# Patient Record
Sex: Female | Born: 1995
Health system: Southern US, Community
[De-identification: ages and names within clinical notes are randomized; demographics above are authoritative.]

## PROBLEM LIST (undated history)

## (undated) DIAGNOSIS — Z789 Other specified health status: Secondary | ICD-10-CM

## (undated) HISTORY — PX: ABDOMINAL SURGERY: SHX537

---

## 2016-08-24 ENCOUNTER — Inpatient Hospital Stay (HOSPITAL_COMMUNITY)
Admission: AD | Admit: 2016-08-24 | Discharge: 2016-08-24 | Disposition: A | Discharge status: Home / Self Care | Payer: Medicaid Other | Source: Ambulatory Visit | Attending: Family Medicine | Admitting: Family Medicine

## 2016-08-24 ENCOUNTER — Encounter (HOSPITAL_COMMUNITY): Payer: Self-pay

## 2016-08-24 DIAGNOSIS — N921 Excessive and frequent menstruation with irregular cycle: Secondary | ICD-10-CM | POA: Diagnosis not present

## 2016-08-24 DIAGNOSIS — Z79899 Other long term (current) drug therapy: Secondary | ICD-10-CM | POA: Diagnosis not present

## 2016-08-24 DIAGNOSIS — N939 Abnormal uterine and vaginal bleeding, unspecified: Secondary | ICD-10-CM | POA: Diagnosis not present

## 2016-08-24 HISTORY — DX: Other specified health status: Z78.9

## 2016-08-24 LAB — CBC WITH DIFFERENTIAL/PLATELET
Basophils Absolute: 0 10*3/uL (ref 0.0–0.1)
Basophils Relative: 1 %
Eosinophils Absolute: 0.1 10*3/uL (ref 0.0–0.7)
Eosinophils Relative: 2 %
HEMATOCRIT: 40.2 % (ref 36.0–46.0)
HEMOGLOBIN: 14.5 g/dL (ref 12.0–15.0)
LYMPHS ABS: 2.8 10*3/uL (ref 0.7–4.0)
LYMPHS PCT: 65 %
MCH: 29.7 pg (ref 26.0–34.0)
MCHC: 36.1 g/dL — AB (ref 30.0–36.0)
MCV: 82.2 fL (ref 78.0–100.0)
MONOS PCT: 5 %
Monocytes Absolute: 0.2 10*3/uL (ref 0.1–1.0)
NEUTROS ABS: 1.1 10*3/uL — AB (ref 1.7–7.7)
NEUTROS PCT: 27 %
Platelets: 296 10*3/uL (ref 150–400)
RBC: 4.89 MIL/uL (ref 3.87–5.11)
RDW: 12.9 % (ref 11.5–15.5)
WBC: 4.2 10*3/uL (ref 4.0–10.5)

## 2016-08-24 LAB — URINALYSIS, ROUTINE W REFLEX MICROSCOPIC
Bacteria, UA: NONE SEEN
Bilirubin Urine: NEGATIVE
GLUCOSE, UA: NEGATIVE mg/dL
Ketones, ur: NEGATIVE mg/dL
Leukocytes, UA: NEGATIVE
NITRITE: NEGATIVE
PH: 6 (ref 5.0–8.0)
PROTEIN: NEGATIVE mg/dL
SPECIFIC GRAVITY, URINE: 1.024 (ref 1.005–1.030)

## 2016-08-24 LAB — WET PREP, GENITAL
Clue Cells Wet Prep HPF POC: NONE SEEN
SPERM: NONE SEEN
Trich, Wet Prep: NONE SEEN
WBC, Wet Prep HPF POC: NONE SEEN
Yeast Wet Prep HPF POC: NONE SEEN

## 2016-08-24 LAB — POCT PREGNANCY, URINE: Preg Test, Ur: NEGATIVE

## 2016-08-24 NOTE — MAU Note (Signed)
Pt states she has been bleeding for 2 days, is on OCP. Has been taking them for 4 years.  Bleeding has been heavy since last Wednesday.  Also having lower abd cramping.

## 2016-08-24 NOTE — MAU Provider Note (Signed)
History     CSN: [id number]  Arrival date and time: 08/24/16 1803   First Provider Initiated Contact with Patient 08/24/16 1909      Chief Complaint  Patient presents with  . Vaginal Bleeding  . Abdominal Pain   HPI Karla Smith is 21 y.o. presenting with abnormal vaginal bleeding that began 08/13/2016.  She missed 2 of her birth control pills on 11/23 and 11/24   Had period 07/17/16 that lasted 3 days.  Then started new pack of pills and states she hasn't missed one in this pack.  Has pack with her.  She describes the bleeding as intermittent but heavy at times.  Using several pads in 1 day.  She is sexually active 1 partner.     Past Medical History:  Diagnosis Date  . Medical history non-contributory     Past Surgical History:  Procedure Laterality Date  . ABDOMINAL SURGERY      No family history on file.  Social History  Substance Use Topics  . Smoking status: Never Smoker  . Smokeless tobacco: Never Used  . Alcohol use No    Allergies:  Allergies  Allergen Reactions  . Pamprin Max [Aspirin-Acetaminophen-Caffeine] Nausea And Vomiting    Prescriptions Prior to Admission  Medication Sig Dispense Refill Last Dose  . levonorgestrel-ethinyl estradiol (AVIANE,ALESSE,LESSINA) 0.1-20 MG-MCG tablet Take 1 tablet by mouth daily.   Past Week at Unknown time  . triamcinolone ointment (KENALOG) 0.1 % Apply 1 application topically 2 (two) times daily.   Past Week at Unknown time    Review of Systems  Constitutional: Negative for chills and fever.  Gastrointestinal: Negative for abdominal pain.  Genitourinary: Positive for pelvic pain (mild cramping associated with bleeding) and vaginal bleeding. Negative for difficulty urinating, dysuria, frequency, hematuria and vaginal discharge.  Neurological: Negative for headaches.  Psychiatric/Behavioral: Negative for agitation.       Anxious about her first pelvic exam   Physical Exam   Blood pressure 121/75, pulse 67,  temperature 98.4 F (36.9 C), temperature source Oral, resp. rate 16, height 5\' 1"  (1.549 m), weight 176 lb (79.8 kg), last menstrual period 08/13/2016.  Physical Exam  Nursing note and vitals reviewed. Constitutional: She is oriented to person, place, and time. She appears well-developed and well-nourished. No distress.  Anxious about first pelvic exam but did really well.  HENT:  Head: Normocephalic.  Neck: Normal range of motion.  Cardiovascular: Normal rate.   Respiratory: Effort normal.  GI: Soft. She exhibits no distension and no mass. There is no tenderness. There is no rebound and no guarding.  Genitourinary: There is no rash, tenderness or lesion on the right labia. There is no rash, tenderness or lesion on the left labia. Uterus is not enlarged and not tender. Cervix exhibits no motion tenderness and no discharge. Right adnexum displays no mass, no tenderness and no fullness. Left adnexum displays no mass, no tenderness and no fullness. There is bleeding (not actively bleeding.  Small amount of bright red blood in canal without clots.) in the vagina. No erythema or tenderness in the vagina. No vaginal discharge found.  Neurological: She is alert and oriented to person, place, and time.  Skin: Skin is warm and dry.  Psychiatric: She has a normal mood and affect. Her behavior is normal.   Results for orders placed or performed during the hospital encounter of 08/24/16 (from the past 24 hour(s))  Urinalysis, Routine w reflex microscopic     Status: Abnormal   Collection Time:  08/24/16  6:38 PM  Result Value Ref Range   Color, Urine YELLOW YELLOW   APPearance HAZY (A) CLEAR   Specific Gravity, Urine 1.024 1.005 - 1.030   pH 6.0 5.0 - 8.0   Glucose, UA NEGATIVE NEGATIVE mg/dL   Hgb urine dipstick LARGE (A) NEGATIVE   Bilirubin Urine NEGATIVE NEGATIVE   Ketones, ur NEGATIVE NEGATIVE mg/dL   Protein, ur NEGATIVE NEGATIVE mg/dL   Nitrite NEGATIVE NEGATIVE   Leukocytes, UA NEGATIVE  NEGATIVE   RBC / HPF TOO NUMEROUS TO COUNT 0 - 5 RBC/hpf   WBC, UA 0-5 0 - 5 WBC/hpf   Bacteria, UA NONE SEEN NONE SEEN   Squamous Epithelial / LPF 0-5 (A) NONE SEEN   Mucous PRESENT   Pregnancy, urine POC     Status: None   Collection Time: 08/24/16  6:47 PM  Result Value Ref Range   Preg Test, Ur NEGATIVE NEGATIVE  Wet prep, genital     Status: None   Collection Time: 08/24/16  7:30 PM  Result Value Ref Range   Yeast Wet Prep HPF POC NONE SEEN NONE SEEN   Trich, Wet Prep NONE SEEN NONE SEEN   Clue Cells Wet Prep HPF POC NONE SEEN NONE SEEN   WBC, Wet Prep HPF POC NONE SEEN NONE SEEN   Sperm NONE SEEN   CBC with Differential/Platelet     Status: Abnormal   Collection Time: 08/24/16  7:48 PM  Result Value Ref Range   WBC 4.2 4.0 - 10.5 K/uL   RBC 4.89 3.87 - 5.11 MIL/uL   Hemoglobin 14.5 12.0 - 15.0 g/dL   HCT 16.1 09.6 - 04.5 %   MCV 82.2 78.0 - 100.0 fL   MCH 29.7 26.0 - 34.0 pg   MCHC 36.1 (H) 30.0 - 36.0 g/dL   RDW 40.9 81.1 - 91.4 %   Platelets 296 150 - 400 K/uL   Neutrophils Relative % 27 %   Neutro Abs 1.1 (L) 1.7 - 7.7 K/uL   Lymphocytes Relative 65 %   Lymphs Abs 2.8 0.7 - 4.0 K/uL   Monocytes Relative 5 %   Monocytes Absolute 0.2 0.1 - 1.0 K/uL   Eosinophils Relative 2 %   Eosinophils Absolute 0.1 0.0 - 0.7 K/uL   Basophils Relative 1 %   Basophils Absolute 0.0 0.0 - 0.1 K/uL    MAU Course  Procedures  MDM MSE EXAM Labs  Assessment and Plan  A:  Break through bleeding associated with oral contraceptive pills      Normal gyn exam  P:  Instructed to take 2 more pills in current row then skip to placebos (saving 1 week for dropped or misplaced pills) and go to new pack to "reset" cycles.  Call her prescribing MD if she continues with bleeding problems with OCPs.   KEY,EVE M 08/24/2016, 8:39 PM

## 2016-08-24 NOTE — Discharge Instructions (Signed)
Oral Contraception Use Oral contraceptive pills (OCPs) are medicines taken to prevent pregnancy. OCPs work by preventing the ovaries from releasing eggs. The hormones in OCPs also cause the cervical mucus to thicken, preventing the sperm from entering the uterus. The hormones also cause the uterine lining to become thin, not allowing a fertilized egg to attach to the inside of the uterus. OCPs are highly effective when taken exactly as prescribed. However, OCPs do not prevent sexually transmitted diseases (STDs). Safe sex practices, such as using condoms along with an OCP, can help prevent STDs. Before taking OCPs, you may have a physical exam and Pap test. Your health care provider may also order blood tests if necessary. Your health care provider will make sure you are a good candidate for oral contraception. Discuss with your health care provider the possible side effects of the OCP you may be prescribed. When starting an OCP, it can take 2 to 3 months for the body to adjust to the changes in hormone levels in your body. How to take oral contraceptive pills Your health care provider may advise you on how to start taking the first cycle of OCPs. Otherwise, you can:  Start on day 1 of your menstrual period. You will not need any backup contraceptive protection with this start time.  Start on the first Sunday after your menstrual period or the day you get your prescription. In these cases, you will need to use backup contraceptive protection for the first week.  Start the pill at any time of your cycle. If you take the pill within 5 days of the start of your period, you are protected against pregnancy right away. In this case, you will not need a backup form of birth control. If you start at any other time of your menstrual cycle, you will need to use another form of birth control for 7 days. If your OCP is the type called a minipill, it will protect you from pregnancy after taking it for 2 days (48  hours). After you have started taking OCPs:  If you forget to take 1 pill, take it as soon as you remember. Take the next pill at the regular time.  If you miss 2 or more pills, call your health care provider because different pills have different instructions for missed doses. Use backup birth control until your next menstrual period starts.  If you use a 28-day pack that contains inactive pills and you miss 1 of the last 7 pills (pills with no hormones), it will not matter. Throw away the rest of the non-hormone pills and start a new pill pack. No matter which day you start the OCP, you will always start a new pack on that same day of the week. Have an extra pack of OCPs and a backup contraceptive method available in case you miss some pills or lose your OCP pack. Follow these instructions at home:  Do not smoke.  Always use a condom to protect against STDs. OCPs do not protect against STDs.  Use a calendar to mark your menstrual period days.  Read the information and directions that came with your OCP. Talk to your health care provider if you have questions. Contact a health care provider if:  You develop nausea and vomiting.  You have abnormal vaginal discharge or bleeding.  You develop a rash.  You miss your menstrual period.  You are losing your hair.  You need treatment for mood swings or depression.  You get dizzy  when taking the OCP.  You develop acne from taking the OCP.  You become pregnant. Get help right away if:  You develop chest pain.  You develop shortness of breath.  You have an uncontrolled or severe headache.  You develop numbness or slurred speech.  You develop visual problems.  You develop pain, redness, and swelling in the legs. This information is not intended to replace advice given to you by your health care provider. Make sure you discuss any questions you have with your health care provider. Document Released: 07/23/2011 Document Revised:  01/09/2016 Document Reviewed: 01/22/2013 Elsevier Interactive Patient Education  2017 [employer] Abnormal Uterine Bleeding Abnormal uterine bleeding means bleeding from the vagina that is not your normal menstrual period. This can be:  Bleeding or spotting between periods.  Bleeding after sex (sexual intercourse).  Bleeding that is heavier or more than normal.  Periods that last longer than usual.  Bleeding after menopause. There are many problems that may cause this. Treatment will depend on the cause of the bleeding. Any kind of bleeding that is not normal should be reviewed by your doctor. Follow these instructions at home: Watch your condition for any changes. These actions may lessen any discomfort you are having:  Do not use tampons or douches as told by your doctor.  Change your pads often. You should get regular pelvic exams and Pap tests. Keep all appointments for tests as told by your doctor. Contact a doctor if:  You are bleeding for more than 1 week.  You feel dizzy at times. Get help right away if:  You pass out.  You have to change pads every 15 to 30 minutes.  You have belly pain.  You have a fever.  You become sweaty or weak.  You are passing large blood clots from the vagina.  You feel sick to your stomach (nauseous) and throw up (vomit). This information is not intended to replace advice given to you by your health care provider. Make sure you discuss any questions you have with your health care provider. Document Released: 05/31/2009 Document Revised: 01/09/2016 Document Reviewed: 03/02/2013 Elsevier Interactive Patient Education  2017 [employer]

## 2016-08-25 LAB — HIV ANTIBODY (ROUTINE TESTING W REFLEX): HIV SCREEN 4TH GENERATION: NONREACTIVE

## 2016-08-26 LAB — GC/CHLAMYDIA PROBE AMP (~~LOC~~) NOT AT ARMC
Chlamydia: NEGATIVE
NEISSERIA GONORRHEA: NEGATIVE

## 2016-12-21 ENCOUNTER — Encounter (HOSPITAL_COMMUNITY): Payer: Self-pay | Admitting: Emergency Medicine

## 2016-12-21 ENCOUNTER — Ambulatory Visit (HOSPITAL_COMMUNITY)
Admission: EM | Admit: 2016-12-21 | Discharge: 2016-12-21 | Disposition: A | Discharge status: Home / Self Care | Payer: Medicaid Other | Attending: Internal Medicine | Admitting: Internal Medicine

## 2016-12-21 DIAGNOSIS — R05 Cough: Secondary | ICD-10-CM | POA: Diagnosis not present

## 2016-12-21 DIAGNOSIS — R0981 Nasal congestion: Secondary | ICD-10-CM

## 2016-12-21 DIAGNOSIS — J029 Acute pharyngitis, unspecified: Secondary | ICD-10-CM | POA: Diagnosis not present

## 2016-12-21 LAB — POCT INFECTIOUS MONO SCREEN: Mono Screen: NEGATIVE

## 2016-12-21 LAB — POCT RAPID STREP A: STREPTOCOCCUS, GROUP A SCREEN (DIRECT): NEGATIVE

## 2016-12-21 MED ORDER — FLUTICASONE PROPIONATE 50 MCG/ACT NA SUSP: 2.0000 | Freq: Every day | NASAL | 0 refills | Status: DC

## 2016-12-21 NOTE — ED Triage Notes (Signed)
Here for allergy sx onset 1 week ++ associated w/ST, nasal congestion, dry cough  Denies fevers, chills  Taking OTC Robitussin w/temp relief  A&O x4... NAD

## 2016-12-21 NOTE — ED Provider Notes (Signed)
[facility]    CSN: [id number] Arrival date & time: 12/21/16  1249     History   Chief Complaint Chief Complaint  Patient presents with  . Allergies    HPI Karla Smith is a 21 y.o. female. Had a lot of difficulty last week with runny/congested nose, some improvement this week. Persistent sore throat, now a new cough. No fever, no achiness. No nausea/vomiting, no diarrhea.    HPI  Past Medical History:  Diagnosis Date  . Medical history non-contributory      Past Surgical History:  Procedure Laterality Date  . ABDOMINAL SURGERY        Home Medications    Prior to Admission medications   Medication Sig Start Date End Date Taking? Authorizing Provider  levonorgestrel-ethinyl estradiol (AVIANE,ALESSE,LESSINA) 0.1-20 MG-MCG tablet Take 1 tablet by mouth daily.   Yes [provider]  fluticasone (FLONASE) 50 MCG/ACT nasal spray Place 2 sprays into both nostrils daily. 12/21/16   Eustace MooreMurray, Roselie Cirigliano W, MD  triamcinolone ointment (KENALOG) 0.1 % Apply 1 application topically 2 (two) times daily.    [provider]    Family History History reviewed. No pertinent family history.  Social History Social History  Substance Use Topics  . Smoking status: Never Smoker  . Smokeless tobacco: Never Used  . Alcohol use No     Allergies   Pamprin max [aspirin-acetaminophen-caffeine]   Review of Systems Review of Systems  All other systems reviewed and are negative.    Physical Exam Triage Vital Signs ED Triage Vitals  Enc Vitals Group     BP 12/21/16 1320 117/68     Pulse Rate 12/21/16 1320 77     Resp 12/21/16 1320 20     Temp 12/21/16 1320 98.7 F (37.1 C)     Temp Source 12/21/16 1320 Oral     SpO2 12/21/16 1320 99 %     Weight --      Height --      Pain Score 12/21/16 1319 9     Pain Loc --    Updated Vital Signs BP 117/68 (BP Location: Right Arm)   Pulse 77   Temp 98.7 F (37.1 C) (Oral)   Resp 20   LMP 11/30/2016 (LMP  Unknown)   SpO2 99%   Physical Exam  Constitutional: She is oriented to person, place, and time. No distress.  HENT:  Head: Atraumatic.  B TMs translucent, flushed pink on right, no erythema no left Mod nasal congestion with mucusy material present Tonsils prominent/red with exudate  Eyes:  Conjugate gaze observed, no eye redness/discharge  Neck: Neck supple.  Cardiovascular: Normal rate.   Pulmonary/Chest: No respiratory distress.  Abdominal: She exhibits no distension.  Musculoskeletal: Normal range of motion.  Neurological: She is alert and oriented to person, place, and time.  Skin: Skin is warm and dry.  Nursing note and vitals reviewed.    UC Treatments / Results  Labs Results for orders placed or performed during the hospital encounter of 12/21/16  Culture, group A strep  Result Value Ref Range   Specimen Description THROAT    Special Requests NONE    Culture TOO YOUNG TO READ    Report Status PENDING   POCT rapid strep A [facility]  Result Value Ref Range   Streptococcus, Group A Screen (Direct) NEGATIVE NEGATIVE  Infectious mono screen, POC  Result Value Ref Range   Mono Screen NEGATIVE NEGATIVE    Procedures Procedures (including critical care  time) None today  Final Clinical Impressions(s) / UC Diagnoses   Final diagnoses:  Acute sore throat  Sinus congestion   Tests for mono and strep were negative today.  A throat culture is pending; the urgent care will contact you if any further treatment is needed.  Prescription for flonase (nasal steroid spray) sent to the [facility] on [address].  Push fluids.  Recheck for new fever >100.5, increasing phlegm production/nasal discharge, or if not starting to improve in a few days.    New Prescriptions Discharge Medication List as of 12/21/2016  3:18 PM    START taking these medications   Details  fluticasone (FLONASE) 50 MCG/ACT nasal spray Place 2 sprays into both nostrils daily., Starting Mon 12/21/2016,  Normal         Eustace Moore, MD 12/22/16 2218

## 2016-12-21 NOTE — Discharge Instructions (Addendum)
Tests for mono and strep were negative today.  A throat culture is pending; the urgent care will contact you if any further treatment is needed.  Prescription for flonase (nasal steroid spray) sent to the [facility] on [address].  Push fluids.  Recheck for new fever >100.5, increasing phlegm production/nasal discharge, or if not starting to improve in a few days.

## 2016-12-24 LAB — CULTURE, GROUP A STREP (THRC)

## 2017-04-23 ENCOUNTER — Inpatient Hospital Stay (HOSPITAL_COMMUNITY)
Admission: AD | Admit: 2017-04-23 | Discharge: 2017-04-23 | Disposition: A | Discharge status: Home / Self Care | Payer: Medicaid Other | Source: Ambulatory Visit | Attending: Obstetrics and Gynecology | Admitting: Obstetrics and Gynecology

## 2017-04-23 ENCOUNTER — Encounter (HOSPITAL_COMMUNITY): Payer: Self-pay

## 2017-04-23 DIAGNOSIS — N938 Other specified abnormal uterine and vaginal bleeding: Secondary | ICD-10-CM

## 2017-04-23 DIAGNOSIS — N939 Abnormal uterine and vaginal bleeding, unspecified: Secondary | ICD-10-CM | POA: Diagnosis present

## 2017-04-23 LAB — URINALYSIS, ROUTINE W REFLEX MICROSCOPIC
BILIRUBIN URINE: NEGATIVE
GLUCOSE, UA: NEGATIVE mg/dL
KETONES UR: NEGATIVE mg/dL
LEUKOCYTES UA: NEGATIVE
NITRITE: NEGATIVE
PH: 5 (ref 5.0–8.0)
Protein, ur: NEGATIVE mg/dL
Specific Gravity, Urine: 1.021 (ref 1.005–1.030)

## 2017-04-23 LAB — WET PREP, GENITAL
Clue Cells Wet Prep HPF POC: NONE SEEN
SPERM: NONE SEEN
TRICH WET PREP: NONE SEEN
WBC, Wet Prep HPF POC: NONE SEEN
Yeast Wet Prep HPF POC: NONE SEEN

## 2017-04-23 LAB — POCT PREGNANCY, URINE: Preg Test, Ur: NEGATIVE

## 2017-04-23 MED ORDER — DROSPIRENONE-ETHINYL ESTRADIOL 3-0.03 MG PO TABS: 1.0000 | ORAL_TABLET | Freq: Every day | ORAL | 11 refills | Status: DC

## 2017-04-23 NOTE — MAU Note (Signed)
For some reason she keeps bleeding and it's not her menstrual. Started bleeding again this morning. Neg HPT.

## 2017-04-23 NOTE — MAU Provider Note (Signed)
Patient Karla Smith is a 21 y.o. G0P0000 Non-pregnant female here with complaints of irregular periods. She denies vaginal discharge, dysuria, or painful intercourse,.  History     CSN: [id number]  Arrival date and time: 04/23/17 [id number]   First Provider Initiated Contact with Patient 04/23/17 [phone]      Chief Complaint  Patient presents with  . Vaginal Bleeding   Vaginal Bleeding  The patient's primary symptoms include vaginal bleeding. The patient's pertinent negatives include no genital odor. This is a new problem. The current episode started more than 1 month ago. The problem occurs intermittently. Pertinent negatives include no constipation or diarrhea. The vaginal discharge was bloody. The vaginal bleeding is typical of menses. She has not been passing clots. She has not been passing tissue. Nothing aggravates the symptoms. She has tried nothing for the symptoms. She uses oral contraceptives for contraception.   She had her period in early August; it finished on August 9th. Her period then returned Aug 24-30 and Sep 1-3. She is concerned because this has not happened in the past.  OB History    Gravida Para Term Preterm AB Living   0 0 0 0 0 0   SAB TAB Ectopic Multiple Live Births   0 0 0 0 0      Past Medical History:  Diagnosis Date  . Medical history non-contributory     Past Surgical History:  Procedure Laterality Date  . ABDOMINAL SURGERY      History reviewed. No pertinent family history.  Social History  Substance Use Topics  . Smoking status: Never Smoker  . Smokeless tobacco: Never Used  . Alcohol use No    Allergies:  Allergies  Allergen Reactions  . Pamprin Max [Aspirin-Acetaminophen-Caffeine] Nausea And Vomiting    Prescriptions Prior to Admission  Medication Sig Dispense Refill Last Dose  . levonorgestrel-ethinyl estradiol (AVIANE,ALESSE,LESSINA) 0.1-20 MG-MCG tablet Take 1 tablet by mouth daily.   04/22/2017 at Unknown time  . triamcinolone  ointment (KENALOG) 0.1 % Apply 1 application topically 2 (two) times daily.   04/22/2017 at Unknown time  . fluticasone (FLONASE) 50 MCG/ACT nasal spray Place 2 sprays into both nostrils daily. 16 g 0     Review of Systems  Respiratory: Negative.   Cardiovascular: Negative.   Gastrointestinal: Negative for constipation and diarrhea.  Genitourinary: Positive for vaginal bleeding.  Neurological: Negative.   Psychiatric/Behavioral: Negative.    Physical Exam   Blood pressure 116/63, pulse 70, temperature 98.4 F (36.9 C), temperature source Oral, resp. rate 18, weight 185 lb 12 oz (84.3 kg), last menstrual period 04/14/2017.  Physical Exam  Constitutional: She is oriented to person, place, and time. She appears well-developed and well-nourished.  HENT:  Head: Normocephalic.  Neck: Normal range of motion.  GI: Soft.  Genitourinary: Vagina normal.  Genitourinary Comments: NEFG; trace amount of dark brown blood in the vagina. No CMT, no suprapubic  Musculoskeletal: Normal range of motion.  Neurological: She is alert and oriented to person, place, and time.  Skin: Skin is warm and dry.  Psychiatric: She has a normal mood and affect.    MAU Course  Procedures  MDM -CBC not done as patient denies heavy bleeding, weakness, SOB.  -Patient has an appt with her PCP on Sept 19; this is the same doctor that prescribed her the birth control to begin with.    Assessment and Plan   1. Dysfunctional uterine bleeding    2. Discussed the fact that she may need more  hormones to help with her breakthrough bleeding. Rx for Yasmin sent to pharmacy; reviewed with patient that it might take some time for her hormones to stabilize and she should keep her appt on the 19th.  3. All questions answered; patient stable for discharge.    Karla Smith CNM 04/23/2017, 9:01 AM

## 2017-04-23 NOTE — Discharge Instructions (Signed)

## 2017-04-26 LAB — GC/CHLAMYDIA PROBE AMP (~~LOC~~) NOT AT ARMC
Chlamydia: NEGATIVE
Neisseria Gonorrhea: NEGATIVE

## 2017-05-05 ENCOUNTER — Ambulatory Visit (INDEPENDENT_AMBULATORY_CARE_PROVIDER_SITE_OTHER): Payer: Medicaid Other | Admitting: Obstetrics and Gynecology

## 2017-05-05 ENCOUNTER — Encounter: Payer: Self-pay | Admitting: Obstetrics and Gynecology

## 2017-05-05 VITALS — BP 113/79 | HR 68 | Ht 62.0 in | Wt 184.6 lb

## 2017-05-05 DIAGNOSIS — N938 Other specified abnormal uterine and vaginal bleeding: Secondary | ICD-10-CM | POA: Diagnosis not present

## 2017-05-05 NOTE — Progress Notes (Signed)
   GYNECOLOGY OFFICE VISIT NOTE  History:  21 y.o. G0P0000 here today for vaginal bleeding. Patient states that she had had normal menstraul cycles up until about 3 months ago. Patient states that she was on the OCP Aviane for 3 years and enjoyed it. Her pharmacy ran out of that brand and she had to switch to a different one. At that time her menstraul irregularities began. She went to the MAU for evaluation about 2 weeks ago and was switched to a higher dose OCP. Patient states she tool this for 2 weeks and discontinued because her bleeding became heavier. She would like to switch to something that will reduce her bleeding. She denies any abnormal vaginal discharge, pelvic pain, fevers, or other concerns.   Patient's last menstrual period was 04/19/2017 (exact date).   Past Medical History:  Diagnosis Date  . Medical history non-contributory     Past Surgical History:  Procedure Laterality Date  . ABDOMINAL SURGERY      The following portions of the patient's history were reviewed and updated as appropriate: allergies, current medications, past family history, past medical history, past social history, past surgical history and problem list.   Review of Systems:  Pertinent items noted in HPI and remainder of comprehensive ROS otherwise negative.   Objective:  Physical Exam BP 113/79   Pulse 68   Ht  (1.575 m)   Wt 83.7 kg (184 lb 9.6 oz)   LMP 04/19/2017 (Exact Date)   BMI 33.76 kg/m  CONSTITUTIONAL: Well-developed, well-nourished female in no acute distress.  SKIN: Skin is warm and dry. No rash noted. Not diaphoretic. No erythema. No pallor. PSYCHIATRIC: Normal mood and affect. Normal behavior. Normal judgment and thought content. CARDIOVASCULAR: Normal heart rate noted RESPIRATORY: Effort and breath sounds normal, no problems with respiration noted ABDOMEN: Soft, non-tender, no distention noted.   PELVIC: Deferred MUSCULOSKELETAL: Normal range of motion. No edema  noted.  Labs and Imaging No results found.  Assessment & Plan:  1. DUB (dysfunctional uterine bleeding) Discussed with patient diagnosis. Recommended she try taking the new pills for a whole month before discontinuing. Discussed how the heavier bleeding after dose increase was common. Patient considering IUD and wants to do more research. Will get CBC due to heavy bleeding to check for anemia. If bleeding not improved consider further work-up for DUB.  - CBC  Routine measures emphasized. Please refer to After Visit Summary for other counseling recommendations.   Return in about 6 weeks (around 06/16/2017) for follow-up DUB.   Total face-to-face time with patient: 15 minutes. Over 50% of encounter was spent on counseling and coordination of care.  Caryl Ada, DO OB Fellow 05/05/2017, 3:04 PM

## 2017-05-05 NOTE — Patient Instructions (Signed)
Contraception Choices Contraception (birth control) is the use of any methods or devices to prevent pregnancy. Below are some methods to help avoid pregnancy. Hormonal methods  Contraceptive implant. This is a thin, plastic tube containing progesterone hormone. It does not contain estrogen hormone. Your health care provider inserts the tube in the inner part of the upper arm. The tube can remain in place for up to 3 years. After 3 years, the implant must be removed. The implant prevents the ovaries from releasing an egg (ovulation), thickens the cervical mucus to prevent sperm from entering the uterus, and thins the lining of the inside of the uterus.  Progesterone-only injections. These injections are given every 3 months by your health care provider to prevent pregnancy. This synthetic progesterone hormone stops the ovaries from releasing eggs. It also thickens cervical mucus and changes the uterine lining. This makes it harder for sperm to survive in the uterus.  Birth control pills. These pills contain estrogen and progesterone hormone. They work by preventing the ovaries from releasing eggs (ovulation). They also cause the cervical mucus to thicken, preventing the sperm from entering the uterus. Birth control pills are prescribed by a health care provider.Birth control pills can also be used to treat heavy periods.  Minipill. This type of birth control pill contains only the progesterone hormone. They are taken every day of each month and must be prescribed by your health care provider.  Birth control patch. The patch contains hormones similar to those in birth control pills. It must be changed once a week and is prescribed by a health care provider.  Vaginal ring. The ring contains hormones similar to those in birth control pills. It is left in the vagina for 3 weeks, removed for 1 week, and then a new one is put back in place. The patient must be comfortable inserting and removing the ring from  the vagina.A health care provider's prescription is necessary.  Emergency contraception. Emergency contraceptives prevent pregnancy after unprotected sexual intercourse. This pill can be taken right after sex or up to 5 days after unprotected sex. It is most effective the sooner you take the pills after having sexual intercourse. Most emergency contraceptive pills are available without a prescription. Check with your pharmacist. Do not use emergency contraception as your only form of birth control. Barrier methods  Female condom. This is a thin sheath (latex or rubber) that is worn over the penis during sexual intercourse. It can be used with spermicide to increase effectiveness.  Female condom. This is a soft, loose-fitting sheath that is put into the vagina before sexual intercourse.  Diaphragm. This is a soft, latex, dome-shaped barrier that must be fitted by a health care provider. It is inserted into the vagina, along with a spermicidal jelly. It is inserted before intercourse. The diaphragm should be left in the vagina for 6 to 8 hours after intercourse.  Cervical cap. This is a round, soft, latex or plastic cup that fits over the cervix and must be fitted by a health care provider. The cap can be left in place for up to 48 hours after intercourse.  Sponge. This is a soft, circular piece of polyurethane foam. The sponge has spermicide in it. It is inserted into the vagina after wetting it and before sexual intercourse.  Spermicides. These are chemicals that kill or block sperm from entering the cervix and uterus. They come in the form of creams, jellies, suppositories, foam, or tablets. They do not require a prescription. They   are inserted into the vagina with an applicator before having sexual intercourse. The process must be repeated every time you have sexual intercourse. Intrauterine contraception  Intrauterine device (IUD). This is a T-shaped device that is put in a woman's uterus during  a menstrual period to prevent pregnancy. There are 2 types: ? Copper IUD. This type of IUD is wrapped in copper wire and is placed inside the uterus. Copper makes the uterus and fallopian tubes produce a fluid that kills sperm. It can stay in place for 10 years. ? Hormone IUD. This type of IUD contains the hormone progestin (synthetic progesterone). The hormone thickens the cervical mucus and prevents sperm from entering the uterus, and it also thins the uterine lining to prevent implantation of a fertilized egg. The hormone can weaken or kill the sperm that get into the uterus. It can stay in place for 3-5 years, depending on which type of IUD is used. Permanent methods of contraception  Female tubal ligation. This is when the woman's fallopian tubes are surgically sealed, tied, or blocked to prevent the egg from traveling to the uterus.  Hysteroscopic sterilization. This involves placing a small coil or insert into each fallopian tube. Your doctor uses a technique called hysteroscopy to do the procedure. The device causes scar tissue to form. This results in permanent blockage of the fallopian tubes, so the sperm cannot fertilize the egg. It takes about 3 months after the procedure for the tubes to become blocked. You must use another form of birth control for these 3 months.  Female sterilization. This is when the female has the tubes that carry sperm tied off (vasectomy).This blocks sperm from entering the vagina during sexual intercourse. After the procedure, the man can still ejaculate fluid (semen). Natural planning methods  Natural family planning. This is not having sexual intercourse or using a barrier method (condom, diaphragm, cervical cap) on days the woman could become pregnant.  Calendar method. This is keeping track of the length of each menstrual cycle and identifying when you are fertile.  Ovulation method. This is avoiding sexual intercourse during ovulation.  Symptothermal method.  This is avoiding sexual intercourse during ovulation, using a thermometer and ovulation symptoms.  Post-ovulation method. This is timing sexual intercourse after you have ovulated. Regardless of which type or method of contraception you choose, it is important that you use condoms to protect against the transmission of sexually transmitted infections (STIs). Talk with your health care provider about which form of contraception is most appropriate for you. This information is not intended to replace advice given to you by your health care provider. Make sure you discuss any questions you have with your health care provider. Document Released: 08/03/2005 Document Revised: 01/09/2016 Document Reviewed: 01/26/2013 Elsevier Interactive Patient Education  2017 Elsevier Inc.  

## 2017-05-06 LAB — CBC
HEMATOCRIT: 40 % (ref 34.0–46.6)
HEMOGLOBIN: 13.6 g/dL (ref 11.1–15.9)
MCH: 29.4 pg (ref 26.6–33.0)
MCHC: 34 g/dL (ref 31.5–35.7)
MCV: 86 fL (ref 79–97)
Platelets: 343 10*3/uL (ref 150–379)
RBC: 4.63 x10E6/uL (ref 3.77–5.28)
RDW: 13.3 % (ref 12.3–15.4)
WBC: 5.1 10*3/uL (ref 3.4–10.8)

## 2017-06-16 ENCOUNTER — Ambulatory Visit: Payer: Medicaid Other | Admitting: Family Medicine

## 2017-09-11 ENCOUNTER — Other Ambulatory Visit: Payer: Self-pay

## 2017-09-11 ENCOUNTER — Encounter (HOSPITAL_COMMUNITY): Payer: Self-pay | Admitting: *Deleted

## 2017-09-11 ENCOUNTER — Inpatient Hospital Stay (HOSPITAL_COMMUNITY)
Admission: AD | Admit: 2017-09-11 | Discharge: 2017-09-12 | Disposition: A | Discharge status: Home / Self Care | Payer: Medicaid Other | Source: Ambulatory Visit | Attending: Obstetrics & Gynecology | Admitting: Obstetrics & Gynecology

## 2017-09-11 DIAGNOSIS — N939 Abnormal uterine and vaginal bleeding, unspecified: Secondary | ICD-10-CM | POA: Diagnosis not present

## 2017-09-11 DIAGNOSIS — Z3202 Encounter for pregnancy test, result negative: Secondary | ICD-10-CM | POA: Insufficient documentation

## 2017-09-11 DIAGNOSIS — N921 Excessive and frequent menstruation with irregular cycle: Secondary | ICD-10-CM

## 2017-09-11 LAB — URINALYSIS, ROUTINE W REFLEX MICROSCOPIC
Bacteria, UA: NONE SEEN
Bilirubin Urine: NEGATIVE
Glucose, UA: NEGATIVE mg/dL
Ketones, ur: NEGATIVE mg/dL
Leukocytes, UA: NEGATIVE
Nitrite: NEGATIVE
PROTEIN: NEGATIVE mg/dL
SPECIFIC GRAVITY, URINE: 1.025 (ref 1.005–1.030)
pH: 6 (ref 5.0–8.0)

## 2017-09-11 LAB — POCT PREGNANCY, URINE: PREG TEST UR: NEGATIVE

## 2017-09-11 NOTE — MAU Note (Addendum)
Pt reports irregular bleeding since September when the [facility] did not have the brand of BCP she took.Marland Kitchen. She was seen in MAU and followed up w/ OBGYN in clinic. Pt was started on new BCP and then switched to a different BCP with no relief of the bleeding. Pt denies Lightheadedness. Pt denies pain. Pt states that she did not want patch or IUD

## 2017-09-11 NOTE — MAU Provider Note (Signed)
History     CSN: [id number]  Arrival date and time: 09/11/17 2216   First Provider Initiated Contact with Patient 09/11/17 2353      Chief Complaint  Patient presents with  . Vaginal Bleeding   HPI Karla Schupp is a 22 y.o. G0P0000 non pregnant female who presents with vaginal bleeding. Patient has been seen in [facility] for AUB. Symptoms began in June of 2018 after [facility] stopped carrying her brand of OCPs. States she has had daily bleeding with the new OCP. Was started on new pill after being seen in [facility]U but it didn't improve. Had appointment in th office & was told to continue to use the new pill & to f/u in 6 weeks. Patient no showed for 6 week follow up. States she bleeds every day. Does not saturate pads or pass clots. Denies abdominal pain. Denies any change in symptoms.    Past Medical History:  Diagnosis Date  . Medical history non-contributory     Past Surgical History:  Procedure Laterality Date  . ABDOMINAL SURGERY      Family History  Problem Relation Age of Onset  . Diabetes Paternal Grandmother   . Diabetes Maternal Grandmother     Social History   Tobacco Use  . Smoking status: Never Smoker  . Smokeless tobacco: Never Used  Substance Use Topics  . Alcohol use: No  . Drug use: No    Allergies:  Allergies  Allergen Reactions  . Pamprin Max [Aspirin-Acetaminophen-Caffeine] Nausea And Vomiting    Medications Prior to Admission  Medication Sig Dispense Refill Last Dose  . drospirenone-ethinyl estradiol (YASMIN,ZARAH,SYEDA) 3-0.03 MG tablet Take 1 tablet by mouth daily. 1 Package 11 09/10/2017 at Unknown time  . fluticasone (FLONASE) 50 MCG/ACT nasal spray Place 2 sprays into both nostrils daily. (Patient not taking: Reported on 05/05/2017) 16 g 0 Not Taking  . triamcinolone ointment (KENALOG) 0.1 % Apply 1 application topically 2 (two) times daily.   More than a month at Unknown time    Review of Systems  Constitutional: Negative.   Gastrointestinal:  Negative.   Genitourinary: Positive for menstrual problem and vaginal bleeding.   Physical Exam   Blood pressure 130/78, pulse 76, temperature 98.3 F (36.8 C), temperature source Oral, resp. rate 16.  Physical Exam  Nursing note and vitals reviewed. Constitutional: She is oriented to person, place, and time. She appears well-developed and well-nourished. No distress.  HENT:  Head: Normocephalic and atraumatic.  Eyes: Conjunctivae are normal. Right eye exhibits no discharge. Left eye exhibits no discharge. No scleral icterus.  Neck: Normal range of motion.  Respiratory: Effort normal. No respiratory distress.  Neurological: She is alert and oriented to person, place, and time.  Skin: Skin is warm and dry. She is not diaphoretic.  Psychiatric: She has a normal mood and affect. Her behavior is normal. Judgment and thought content normal.    MAU Course  Procedures Results for orders placed or performed during the hospital encounter of 09/11/17 (from the past 24 hour(s))  Urinalysis, Routine w reflex microscopic     Status: Abnormal   Collection Time: 09/11/17 11:15 PM  Result Value Ref Range   Color, Urine YELLOW YELLOW   APPearance HAZY (A) CLEAR   Specific Gravity, Urine 1.025 1.005 - 1.030   pH 6.0 5.0 - 8.0   Glucose, UA NEGATIVE NEGATIVE mg/dL   Hgb urine dipstick MODERATE (A) NEGATIVE   Bilirubin Urine NEGATIVE NEGATIVE   Ketones, ur NEGATIVE NEGATIVE mg/dL   Protein,  ur NEGATIVE NEGATIVE mg/dL   Nitrite NEGATIVE NEGATIVE   Leukocytes, UA NEGATIVE NEGATIVE   RBC / HPF 0-5 0 - 5 RBC/hpf   WBC, UA 0-5 0 - 5 WBC/hpf   Bacteria, UA NONE SEEN NONE SEEN   Squamous Epithelial / LPF 0-5 (A) NONE SEEN   Mucus PRESENT   Pregnancy, urine POC     Status: None   Collection Time: 09/11/17 11:25 PM  Result Value Ref Range   Preg Test, Ur NEGATIVE NEGATIVE  CBC     Status: None   Collection Time: 09/12/17 12:10 AM  Result Value Ref Range   WBC 5.2 4.0 - 10.5 K/uL   RBC 4.60 3.87  - 5.11 MIL/uL   Hemoglobin 13.9 12.0 - 15.0 g/dL   HCT 16.138.8 09.636.0 - 04.546.0 %   MCV 84.3 78.0 - 100.0 fL   MCH 30.2 26.0 - 34.0 pg   MCHC 35.8 30.0 - 36.0 g/dL   RDW 40.912.5 81.111.5 - 91.415.5 %   Platelets 312 150 - 400 K/uL   MDM UPT negative CBC -- hemoglobin normal at 13.9 VSS, NAD Patient had not tried any other pharmacies for her brand OCP. Would like rx for Aviane & will try to find pharmacy that carries that particular brand. States if can't find Aviane would like to d/c OCPs all together. Discussed importance of keeping scheduled f/u appointments; if symptoms don't improve she should f/u in the office  Assessment and Plan  A: 1. Abnormal uterine bleeding (AUB)   2. Breakthrough bleeding on birth control pills    P: Discharge home Rx OCP vs provera Scheduled f/u appt with [facility] if symptoms don't improve  Judeth Hornrin Lamarr Feenstra 09/11/2017, 11:53 PM

## 2017-09-12 DIAGNOSIS — N939 Abnormal uterine and vaginal bleeding, unspecified: Secondary | ICD-10-CM

## 2017-09-12 LAB — CBC
HEMATOCRIT: 38.8 % (ref 36.0–46.0)
Hemoglobin: 13.9 g/dL (ref 12.0–15.0)
MCH: 30.2 pg (ref 26.0–34.0)
MCHC: 35.8 g/dL (ref 30.0–36.0)
MCV: 84.3 fL (ref 78.0–100.0)
Platelets: 312 10*3/uL (ref 150–400)
RBC: 4.6 MIL/uL (ref 3.87–5.11)
RDW: 12.5 % (ref 11.5–15.5)
WBC: 5.2 10*3/uL (ref 4.0–10.5)

## 2017-09-12 MED ORDER — LEVONORGESTREL-ETHINYL ESTRAD 0.1-20 MG-MCG PO TABS: 1.0000 | ORAL_TABLET | Freq: Every day | ORAL | 11 refills | Status: DC

## 2017-09-12 MED ORDER — MEDROXYPROGESTERONE ACETATE 10 MG PO TABS: 10.0000 mg | ORAL_TABLET | Freq: Every day | ORAL | 0 refills | Status: DC

## 2017-09-12 NOTE — Discharge Instructions (Signed)
Abnormal Uterine Bleeding Abnormal uterine bleeding can affect women at various stages in life, including teenagers, women in their reproductive years, pregnant women, and women who have reached menopause. Several kinds of uterine bleeding are considered abnormal, including:  Bleeding or spotting between periods.  Bleeding after sexual intercourse.  Bleeding that is heavier or more than normal.  Periods that last longer than usual.  Bleeding after menopause. Many cases of abnormal uterine bleeding are minor and simple to treat, while others are more serious. Any type of abnormal bleeding should be evaluated by your health care provider. Treatment will depend on the cause of the bleeding. Follow these instructions at home: Monitor your condition for any changes. The following actions may help to alleviate any discomfort you are experiencing:  Avoid the use of tampons and douches as directed by your health care provider.  Change your pads frequently. You should get regular pelvic exams and Pap tests. Keep all follow-up appointments for diagnostic tests as directed by your health care provider. Contact a health care provider if:  Your bleeding lasts more than 1 week.  You feel dizzy at times. Get help right away if:  You pass out.  You are changing pads every 15 to 30 minutes.  You have abdominal pain.  You have a fever.  You become sweaty or weak.  You are passing large blood clots from the vagina.  You start to feel nauseous and vomit. This information is not intended to replace advice given to you by your health care provider. Make sure you discuss any questions you have with your health care provider. Document Released: 08/03/2005 Document Revised: 01/15/2016 Document Reviewed: 03/02/2013 Elsevier Interactive Patient Education  2017 Elsevier Inc.  

## 2019-03-06 DIAGNOSIS — E559 Vitamin D deficiency, unspecified: Secondary | ICD-10-CM | POA: Diagnosis not present

## 2019-03-06 DIAGNOSIS — Z01118 Encounter for examination of ears and hearing with other abnormal findings: Secondary | ICD-10-CM | POA: Diagnosis not present

## 2019-03-06 DIAGNOSIS — Z0001 Encounter for general adult medical examination with abnormal findings: Secondary | ICD-10-CM | POA: Diagnosis not present

## 2019-03-06 DIAGNOSIS — J302 Other seasonal allergic rhinitis: Secondary | ICD-10-CM | POA: Diagnosis not present

## 2019-03-06 DIAGNOSIS — Z131 Encounter for screening for diabetes mellitus: Secondary | ICD-10-CM | POA: Diagnosis not present

## 2019-03-06 DIAGNOSIS — E669 Obesity, unspecified: Secondary | ICD-10-CM | POA: Diagnosis not present

## 2019-03-06 DIAGNOSIS — Z1389 Encounter for screening for other disorder: Secondary | ICD-10-CM | POA: Diagnosis not present

## 2019-03-06 DIAGNOSIS — R7303 Prediabetes: Secondary | ICD-10-CM | POA: Diagnosis not present

## 2019-08-29 ENCOUNTER — Ambulatory Visit (HOSPITAL_COMMUNITY): Admission: EM | Admit: 2019-08-29 | Discharge: 2019-08-29 | Discharge status: Absent without leave | Payer: Self-pay

## 2019-08-29 ENCOUNTER — Other Ambulatory Visit: Payer: Self-pay

## 2019-08-29 ENCOUNTER — Encounter (HOSPITAL_COMMUNITY): Payer: Self-pay | Admitting: Emergency Medicine

## 2019-08-29 ENCOUNTER — Ambulatory Visit (HOSPITAL_COMMUNITY)
Admission: EM | Admit: 2019-08-29 | Discharge: 2019-08-29 | Disposition: A | Discharge status: Home / Self Care | Payer: BC Managed Care – PPO | Attending: Family Medicine | Admitting: Family Medicine

## 2019-08-29 DIAGNOSIS — B373 Candidiasis of vulva and vagina: Secondary | ICD-10-CM

## 2019-08-29 DIAGNOSIS — B3731 Acute candidiasis of vulva and vagina: Secondary | ICD-10-CM

## 2019-08-29 MED ORDER — FLUCONAZOLE 150 MG PO TABS: ORAL_TABLET | ORAL | 0 refills | Status: AC

## 2019-08-29 NOTE — ED Triage Notes (Signed)
Pt states she thinks she has a yeast infection. States "theres yeast down there and its irritated." symptoms x1 week. No pain

## 2019-08-29 NOTE — ED Provider Notes (Addendum)
[place]   [id number] 08/29/19 Arrival Time: 55  ASSESSMENT & PLAN:  1. Yeast vaginitis     Declines STI testing. No s/s of PID. Will treat as a yeast infection.  Begin: Meds ordered this encounter  Medications  . fluconazole (DIFLUCAN) 150 MG tablet    Sig: Take one tablet by mouth as a single dose. May repeat in 3 days if symptoms persist.    Dispense:  2 tablet    Refill:  0    Reviewed expectations re: course of current medical issues. Questions answered. Outlined signs and symptoms indicating need for more acute intervention. Patient verbalized understanding. After Visit Summary given.   SUBJECTIVE:  Karla Smith is a 24 y.o. female who presents with complaint of vaginal irritation and discharge. Onset gradual. First noticed a week ago. Describes discharge as thick and white/clumpy; without odor. No specific aggravating or alleviating factors reported. Denies: urinary frequency, dysuria and gross hematuria. Afebrile. No abdominal or pelvic pain. Normal PO intake wihout n/v. No genital rashes or lesions.  OTC treatment: none. History of STI: h/o yeast infections; "feels like it now".  Patient's last menstrual period was 08/18/2019.  ROS: As per HPI.  OBJECTIVE:  Vitals:   08/29/19 1031  BP: (!) 128/91  Pulse: 90  Resp: 18  Temp: 99 F (37.2 C)  SpO2: 99%     General appearance: alert, cooperative, appears stated age and no distress Throat: lips, mucosa, and tongue normal; teeth and gums normal CV: RRR Lungs: CTAB Back: no CVA tenderness; FROM at waist Abdomen: soft, non-tender GU: deferred Skin: warm and dry Psychological: alert and cooperative; normal mood and affect.     Allergies  Allergen Reactions  . Pamprin Max [Aspirin-Acetaminophen-Caffeine] Nausea And Vomiting    Past Medical History:  Diagnosis Date  . Medical history non-contributory    Family History  Problem Relation Age of Onset  . Diabetes Paternal  Grandmother   . Diabetes Maternal Grandmother    Social History   Socioeconomic History  . Marital status: Single    Spouse name: Not on file  . Number of children: Not on file  . Years of education: Not on file  . Highest education level: Not on file  Occupational History  . Not on file  Tobacco Use  . Smoking status: Never Smoker  . Smokeless tobacco: Never Used  Substance and Sexual Activity  . Alcohol use: No  . Drug use: No  . Sexual activity: Yes    Birth control/protection: Pill  Other Topics Concern  . Not on file  Social History Narrative  . Not on file   Social Determinants of Health   Financial Resource Strain:   . Difficulty of Paying Living Expenses: Not on file  Food Insecurity:   . Worried About Charity fundraiser in the Last Year: Not on file  . Ran Out of Food in the Last Year: Not on file  Transportation Needs:   . Lack of Transportation (Medical): Not on file  . Lack of Transportation (Non-Medical): Not on file  Physical Activity:   . Days of Exercise per Week: Not on file  . Minutes of Exercise per Session: Not on file  Stress:   . Feeling of Stress : Not on file  Social Connections:   . Frequency of Communication with Friends and Family: Not on file  . Frequency of Social Gatherings with Friends and Family: Not on file  . Attends Religious Services: Not on file  .  Active Member of Clubs or Organizations: Not on file  . Attends Banker Meetings: Not on file  . Marital Status: Not on file  Intimate Partner Violence:   . Fear of Current or Ex-Partner: Not on file  . Emotionally Abused: Not on file  . Physically Abused: Not on file  . Sexually Abused: Not on file          Mardella Layman, MD 08/29/19 1046    Mardella Layman, MD 08/29/19 1046

## 2019-08-29 NOTE — ED Triage Notes (Signed)
Duplicate chart, marked for merge.

## 2019-09-19 DIAGNOSIS — Z0001 Encounter for general adult medical examination with abnormal findings: Secondary | ICD-10-CM | POA: Diagnosis not present

## 2019-09-19 DIAGNOSIS — Z1329 Encounter for screening for other suspected endocrine disorder: Secondary | ICD-10-CM | POA: Diagnosis not present

## 2019-09-19 DIAGNOSIS — J302 Other seasonal allergic rhinitis: Secondary | ICD-10-CM | POA: Diagnosis not present

## 2019-09-19 DIAGNOSIS — E782 Mixed hyperlipidemia: Secondary | ICD-10-CM | POA: Diagnosis not present

## 2019-09-19 DIAGNOSIS — E559 Vitamin D deficiency, unspecified: Secondary | ICD-10-CM | POA: Diagnosis not present

## 2019-09-19 DIAGNOSIS — Z136 Encounter for screening for cardiovascular disorders: Secondary | ICD-10-CM | POA: Diagnosis not present

## 2019-09-19 DIAGNOSIS — R7303 Prediabetes: Secondary | ICD-10-CM | POA: Diagnosis not present

## 2019-09-19 DIAGNOSIS — E6609 Other obesity due to excess calories: Secondary | ICD-10-CM | POA: Diagnosis not present

## 2019-09-19 DIAGNOSIS — Z131 Encounter for screening for diabetes mellitus: Secondary | ICD-10-CM | POA: Diagnosis not present

## 2019-09-19 DIAGNOSIS — Z01021 Encounter for examination of eyes and vision following failed vision screening with abnormal findings: Secondary | ICD-10-CM | POA: Diagnosis not present

## 2019-09-19 DIAGNOSIS — Z113 Encounter for screening for infections with a predominantly sexual mode of transmission: Secondary | ICD-10-CM | POA: Diagnosis not present

## 2019-09-21 DIAGNOSIS — R7989 Other specified abnormal findings of blood chemistry: Secondary | ICD-10-CM | POA: Diagnosis not present

## 2019-09-21 DIAGNOSIS — E1165 Type 2 diabetes mellitus with hyperglycemia: Secondary | ICD-10-CM | POA: Diagnosis not present

## 2019-09-21 DIAGNOSIS — E782 Mixed hyperlipidemia: Secondary | ICD-10-CM | POA: Diagnosis not present

## 2019-09-21 DIAGNOSIS — E559 Vitamin D deficiency, unspecified: Secondary | ICD-10-CM | POA: Diagnosis not present

## 2019-10-07 ENCOUNTER — Emergency Department (HOSPITAL_COMMUNITY)
Admission: EM | Admit: 2019-10-07 | Discharge: 2019-10-07 | Disposition: A | Discharge status: Home / Self Care | Payer: BC Managed Care – PPO | Attending: Emergency Medicine | Admitting: Emergency Medicine

## 2019-10-07 ENCOUNTER — Other Ambulatory Visit: Payer: Self-pay

## 2019-10-07 ENCOUNTER — Emergency Department (HOSPITAL_COMMUNITY): Payer: BC Managed Care – PPO

## 2019-10-07 ENCOUNTER — Encounter (HOSPITAL_COMMUNITY): Payer: Self-pay | Admitting: Emergency Medicine

## 2019-10-07 DIAGNOSIS — R112 Nausea with vomiting, unspecified: Secondary | ICD-10-CM | POA: Diagnosis not present

## 2019-10-07 DIAGNOSIS — R1013 Epigastric pain: Secondary | ICD-10-CM | POA: Diagnosis not present

## 2019-10-07 DIAGNOSIS — K920 Hematemesis: Secondary | ICD-10-CM | POA: Diagnosis not present

## 2019-10-07 DIAGNOSIS — Z79899 Other long term (current) drug therapy: Secondary | ICD-10-CM | POA: Diagnosis not present

## 2019-10-07 DIAGNOSIS — R1011 Right upper quadrant pain: Secondary | ICD-10-CM | POA: Insufficient documentation

## 2019-10-07 LAB — URINALYSIS, ROUTINE W REFLEX MICROSCOPIC
Bacteria, UA: NONE SEEN
Bilirubin Urine: NEGATIVE
Glucose, UA: NEGATIVE mg/dL
Ketones, ur: NEGATIVE mg/dL
Leukocytes,Ua: NEGATIVE
Nitrite: NEGATIVE
Protein, ur: NEGATIVE mg/dL
RBC / HPF: >50 RBC/hpf — ABNORMAL HIGH (ref 0–5)
Specific Gravity, Urine: 1.017 (ref 1.005–1.030)
pH: 5 (ref 5.0–8.0)

## 2019-10-07 LAB — CBC WITH DIFFERENTIAL/PLATELET
Abs Immature Granulocytes: 0.01 10*3/uL (ref 0.00–0.07)
Basophils Absolute: 0 10*3/uL (ref 0.0–0.1)
Basophils Relative: 1 %
Eosinophils Absolute: 0.1 10*3/uL (ref 0.0–0.5)
Eosinophils Relative: 1 %
HCT: 40.6 % (ref 36.0–46.0)
Hemoglobin: 13.6 g/dL (ref 12.0–15.0)
Immature Granulocytes: 0 %
Lymphocytes Relative: 21 %
Lymphs Abs: 1.2 10*3/uL (ref 0.7–4.0)
MCH: 28.6 pg (ref 26.0–34.0)
MCHC: 33.5 g/dL (ref 30.0–36.0)
MCV: 85.3 fL (ref 80.0–100.0)
Monocytes Absolute: 0.3 10*3/uL (ref 0.1–1.0)
Monocytes Relative: 5 %
Neutro Abs: 4 10*3/uL (ref 1.7–7.7)
Neutrophils Relative %: 72 %
Platelets: 376 10*3/uL (ref 150–400)
RBC: 4.76 MIL/uL (ref 3.87–5.11)
RDW: 13 % (ref 11.5–15.5)
WBC: 5.5 10*3/uL (ref 4.0–10.5)
nRBC: 0 % (ref 0.0–0.2)

## 2019-10-07 LAB — COMPREHENSIVE METABOLIC PANEL
ALT: 17 U/L (ref 0–44)
AST: 19 U/L (ref 15–41)
Albumin: 4 g/dL (ref 3.5–5.0)
Alkaline Phosphatase: 69 U/L (ref 38–126)
Anion gap: 10 (ref 5–15)
BUN: 12 mg/dL (ref 6–20)
CO2: 21 mmol/L — ABNORMAL LOW (ref 22–32)
Calcium: 9 mg/dL (ref 8.9–10.3)
Chloride: 103 mmol/L (ref 98–111)
Creatinine, Ser: 0.79 mg/dL (ref 0.44–1.00)
GFR calc Af Amer: >60 mL/min (ref >60)
GFR calc non Af Amer: >60 mL/min (ref >60)
Glucose, Bld: 157 mg/dL — ABNORMAL HIGH (ref 70–99)
Potassium: 3.8 mmol/L (ref 3.5–5.1)
Sodium: 134 mmol/L — ABNORMAL LOW (ref 135–145)
Total Bilirubin: 0.4 mg/dL (ref 0.3–1.2)
Total Protein: 7.2 g/dL (ref 6.5–8.1)

## 2019-10-07 LAB — LIPASE, BLOOD: Lipase: 19 U/L (ref 11–51)

## 2019-10-07 LAB — I-STAT BETA HCG BLOOD, ED (MC, WL, AP ONLY): I-stat hCG, quantitative: <5 m[IU]/mL (ref <5)

## 2019-10-07 MED ORDER — FAMOTIDINE 20 MG PO TABS: 20.0000 mg | ORAL_TABLET | Freq: Two times a day (BID) | ORAL | 0 refills | Status: AC

## 2019-10-07 MED ORDER — OMEPRAZOLE 20 MG PO CPDR: 20.0000 mg | DELAYED_RELEASE_CAPSULE | Freq: Every day | ORAL | 0 refills | Status: AC

## 2019-10-07 MED ORDER — SUCRALFATE 1 GM/10ML PO SUSP: 1.0000 g | Freq: Three times a day (TID) | ORAL | 0 refills | Status: AC

## 2019-10-07 NOTE — ED Triage Notes (Signed)
C/o mid upper abd pain and vomited blood x 1 this morning.

## 2019-10-07 NOTE — ED Notes (Signed)
Patient transported to X-ray 

## 2019-10-07 NOTE — Discharge Instructions (Signed)
Please read and follow all provided instructions.  Your diagnoses today include:  1. Epigastric abdominal pain   2. RUQ pain   3. Non-intractable vomiting with nausea, unspecified vomiting type     Tests performed today include:  Blood counts and electrolytes  Blood tests to check liver and kidney function  Blood tests to check pancreas function  Urine test to look for infection and pregnancy (in women)  Ultrasound - does not show any gallstones  Chest x-ray - is normal  Vital signs. See below for your results today.   Medications prescribed:   Pepcid (famotidine) - antihistamine  You can find this medication over-the-counter.   DO NOT exceed:   20mg  Pepcid every 12 hours   Omeprazole (Prilosec) - stomach acid reducer  This medication can be found over-the-counter   Carafate - for stomach upset and to protect your stomach  Take any prescribed medications only as directed.  Home care instructions:   Follow any educational materials contained in this packet.  Follow-up instructions: Please follow-up with your primary care provider in the next 2 days for further evaluation of your symptoms.    Return instructions:  SEEK IMMEDIATE MEDICAL ATTENTION IF:  The pain does not go away or becomes severe   A temperature above 101F develops   Repeated vomiting occurs (multiple episodes)   The pain becomes localized to portions of the abdomen. The right side could possibly be appendicitis. In an adult, the left lower portion of the abdomen could be colitis or diverticulitis.   Blood is being passed in stools or vomit (bright red or black tarry stools)   You develop chest pain, difficulty breathing, dizziness or fainting, or become confused, poorly responsive, or inconsolable (young children)  If you have any other emergent concerns regarding your health  Additional Information: Abdominal (belly) pain can be caused by many things. Your caregiver performed an  examination and possibly ordered blood/urine tests and imaging (CT scan, x-rays, ultrasound). Many cases can be observed and treated at home after initial evaluation in the emergency department. Even though you are being discharged home, abdominal pain can be unpredictable. Therefore, you need a repeated exam if your pain does not resolve, returns, or worsens. Most patients with abdominal pain don't have to be admitted to the hospital or have surgery, but serious problems like appendicitis and gallbladder attacks can start out as nonspecific pain. Many abdominal conditions cannot be diagnosed in one visit, so follow-up evaluations are very important.  Your vital signs today were: BP 124/85   Pulse 60   Temp 98 F (36.7 C) (Oral)   Resp 16   LMP 10/05/2019   SpO2 99%  If your blood pressure (bp) was elevated above 135/85 this visit, please have this repeated by your doctor within one month. --------------

## 2019-10-07 NOTE — ED Provider Notes (Signed)
[facility] EMERGENCY DEPARTMENT Provider Note   CSN: [id number] Arrival date & time: 10/07/19  2025     History Chief Complaint  Patient presents with  . Abdominal Pain    Karla Smith is a 24 y.o. female.  Patient with no past surgical history presents the emergency department with upper abdominal pain and vomiting. Patient states that she woke up this morning with upper abdominal pain that slightly favors the right side. Pain did not radiate. She became nauseous and vomited what she estimates it to be about 1/4 cup of blood. Patient denies any chest pain or shortness of breath. Symptoms have gradually improved. She denies any dark or bloody stools recently. She has had some loose stools since starting Metformin but no constipation. No urinary symptoms. She is currently on her menstrual period. No fevers. She denies heavy alcohol use or NSAID use. She states that she does not like to take any medications. No history of reflux or stomach ulcers. She states that she had a endoscopy several years ago but cannot tell me the reason for this. This was in an outside system.        Past Medical History:  Diagnosis Date  . Medical history non-contributory     There are no problems to display for this patient.   Past Surgical History:  Procedure Laterality Date  . ABDOMINAL SURGERY       OB History    Gravida  0   Para  0   Term  0   Preterm  0   AB  0   Living  0     SAB  0   TAB  0   Ectopic  0   Multiple  0   Live Births  0           Family History  Problem Relation Age of Onset  . Diabetes Paternal Grandmother   . Diabetes Maternal Grandmother     Social History   Tobacco Use  . Smoking status: Never Smoker  . Smokeless tobacco: Never Used  Substance Use Topics  . Alcohol use: No  . Drug use: No    Home Medications Prior to Admission medications   Medication Sig Start Date End Date Taking? Authorizing Provider    fluconazole (DIFLUCAN) 150 MG tablet Take one tablet by mouth as a single dose. May repeat in 3 days if symptoms persist. 08/29/19   Mardella Layman, MD  drospirenone-ethinyl estradiol (YASMIN,ZARAH,SYEDA) 3-0.03 MG tablet Take 1 tablet by mouth daily. Patient not taking: Reported on 08/29/2019 04/23/17 08/29/19  Marylene Land, CNM  fluticasone [facility]) 50 MCG/ACT nasal spray Place 2 sprays into both nostrils daily. Patient not taking: Reported on 05/05/2017 12/21/16 08/29/19  Isa Rankin, MD  levonorgestrel-ethinyl estradiol [facility]) 0.1-20 MG-MCG tablet Take 1 tablet by mouth daily. Patient not taking: Reported on 08/29/2019 09/12/17 08/29/19  Judeth Horn, NP  medroxyPROGESTERone (PROVERA) 10 MG tablet Take 1 tablet (10 mg total) by mouth daily. Patient not taking: Reported on 08/29/2019 09/12/17 08/29/19  Judeth Horn, NP    Allergies    Pamprin max [aspirin-acetaminophen-caffeine]  Review of Systems   Review of Systems  Constitutional: Negative for fever.  HENT: Negative for rhinorrhea and sore throat.   Eyes: Negative for redness.  Respiratory: Negative for cough.   Cardiovascular: Negative for chest pain.  Gastrointestinal: Positive for abdominal pain, diarrhea, nausea and vomiting. Negative for blood in stool and constipation.  Genitourinary: Negative for dysuria.  Musculoskeletal:  Negative for myalgias.  Skin: Negative for rash.  Neurological: Negative for headaches.    Physical Exam Updated Vital Signs BP 124/85   Pulse 60   Temp 98 F (36.7 C) (Oral)   Resp 16   LMP 10/05/2019   SpO2 99%   Physical Exam Vitals and nursing note reviewed.  Constitutional:      Appearance: She is well-developed.  HENT:     Head: Normocephalic and atraumatic.  Eyes:     General:        Right eye: No discharge.        Left eye: No discharge.     Conjunctiva/sclera: Conjunctivae normal.  Cardiovascular:     Rate and Rhythm: Normal rate and regular rhythm.     Heart  sounds: Normal heart sounds.  Pulmonary:     Effort: Pulmonary effort is normal.     Breath sounds: Normal breath sounds.  Abdominal:     Palpations: Abdomen is soft.     Tenderness: There is abdominal tenderness (Mild tenderness to palpation in the upper abdomen, negative Murphy sign, no rebound or guarding) in the right upper quadrant, epigastric area and left upper quadrant.  Musculoskeletal:     Cervical back: Normal range of motion and neck supple.  Skin:    General: Skin is warm and dry.  Neurological:     Mental Status: She is alert.     ED Results / Procedures / Treatments   Labs (all labs ordered are listed, but only abnormal results are displayed) Labs Reviewed  COMPREHENSIVE METABOLIC PANEL - Abnormal; Notable for the following components:      Result Value   Sodium 134 (*)    CO2 21 (*)    Glucose, Bld 157 (*)    All other components within normal limits  URINALYSIS, ROUTINE W REFLEX MICROSCOPIC - Abnormal; Notable for the following components:   Hgb urine dipstick LARGE (*)    RBC / HPF >50 (*)    All other components within normal limits  CBC WITH DIFFERENTIAL/PLATELET  LIPASE, BLOOD  I-STAT BETA HCG BLOOD, ED ([facility], [facility], [facility] ONLY)    Radiology DG Chest 2 View  Result Date: 10/07/2019 CLINICAL DATA:  Abdominal pain and hematemesis. EXAM: CHEST - 2 VIEW COMPARISON:  None. FINDINGS: The heart size and mediastinal contours are within normal limits. Both lungs are clear. The visualized skeletal structures are unremarkable. IMPRESSION: No active cardiopulmonary disease. Electronically Signed   By: Marin Olp M.D.   On: 10/07/2019 08:58    Procedures Procedures (including critical care time)  Medications Ordered in ED Medications - No data to display  ED Course  I have reviewed the triage vital signs and the nursing notes.  Pertinent labs & imaging results that were available during my care of the patient were reviewed by me and considered in my medical  decision making (see chart for details).  Patient seen and examined. Labs reviewed and are reassuring. Family member with history of gallbladder disease. Ultrasound ordered to evaluate for any signs of gallbladder stones. Will determine treatment protocol based on ultrasound findings. Anticipate discharge to home.  Vital signs reviewed and are as follows: BP 124/85   Pulse 60   Temp 98 F (36.7 C) (Oral)   Resp 16   LMP 10/05/2019   SpO2 99%   Patient updated on results.  Will discharge to home with omeprazole, Pepcid, Carafate.  Encourage PCP follow-up.  Encouraged bland diet for the next couple of days.  The patient was urged to return to the Emergency Department immediately with worsening of current symptoms, worsening abdominal pain, persistent vomiting, blood noted in stools, fever, or any other concerns. The patient verbalized understanding.      MDM Rules/Calculators/A&P                       Patient with upper abdominal pain and vomiting this morning.  Patient vomited some blood.  No history of alcoholism or varices.  Red blood cell count is normal without other bleeding complaints. Vitals are stable, no fever. Labs reassuring, normal WBC, slightly high blood sugar without DKA. Imaging no gallstones. No signs of dehydration, patient is tolerating PO's. Lungs are clear and no signs suggestive of PNA.  Suspect gastritis versus PUD.  Low concern for appendicitis, cholecystitis, pancreatitis, ruptured viscus, UTI, kidney stone, aortic dissection, aortic aneurysm or other emergent abdominal etiology. Supportive therapy indicated with return if symptoms worsen.      Final Clinical Impression(s) / ED Diagnoses Final diagnoses:  RUQ pain  Epigastric abdominal pain  Non-intractable vomiting with nausea, unspecified vomiting type    Rx / DC Orders ED Discharge Orders         Ordered    famotidine (PEPCID) 20 MG tablet  2 times daily     10/07/19 1041    omeprazole (PRILOSEC) 20  MG capsule  Daily     10/07/19 1041    sucralfate (CARAFATE) 1 GM/10ML suspension  3 times daily with meals & bedtime     10/07/19 1041           Renne Crigler, PA-C 10/07/19 1300    Margarita Grizzle, MD 10/08/19 [phone]

## 2019-10-09 DIAGNOSIS — L209 Atopic dermatitis, unspecified: Secondary | ICD-10-CM | POA: Diagnosis not present

## 2019-10-28 DIAGNOSIS — Z20822 Contact with and (suspected) exposure to covid-19: Secondary | ICD-10-CM | POA: Diagnosis not present

## 2019-11-08 ENCOUNTER — Ambulatory Visit: Payer: BC Managed Care – PPO | Admitting: Physician Assistant

## 2021-06-06 IMAGING — US US ABDOMEN LIMITED
1 series · 14 of 25 positions shown · non-contrast
Comparison: None.

CLINICAL DATA: Right upper quadrant pain.

EXAM:
ULTRASOUND ABDOMEN LIMITED RIGHT UPPER QUADRANT

[Series 1: us abdomen limited · 14 of 46 slices shown]
[im 1/46]
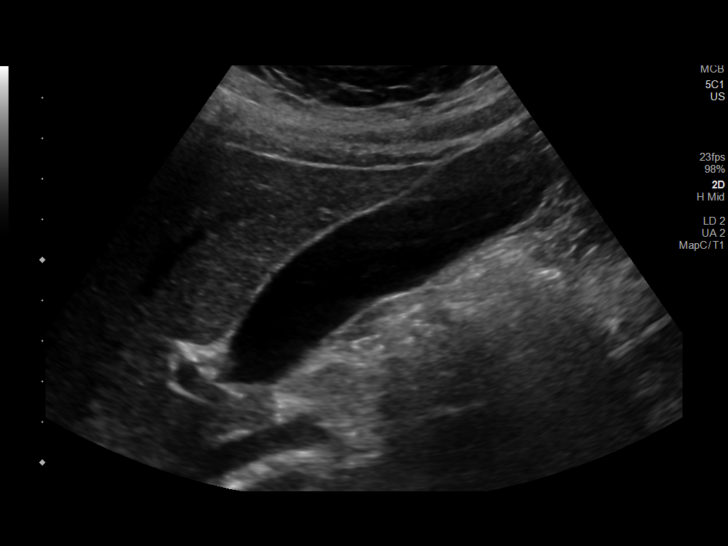
[im 4/46]
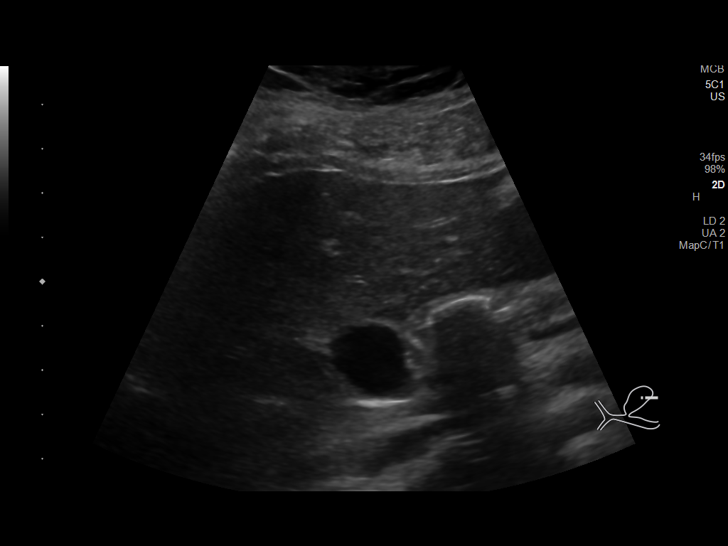
[im 8/46]
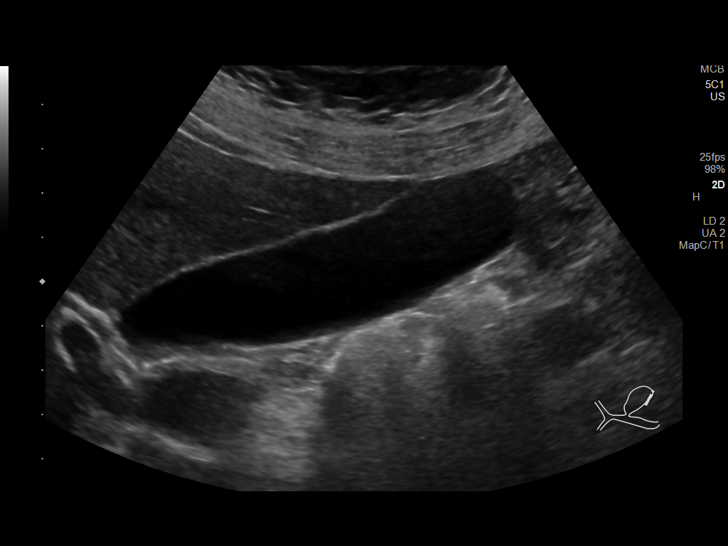
[im 12/46]
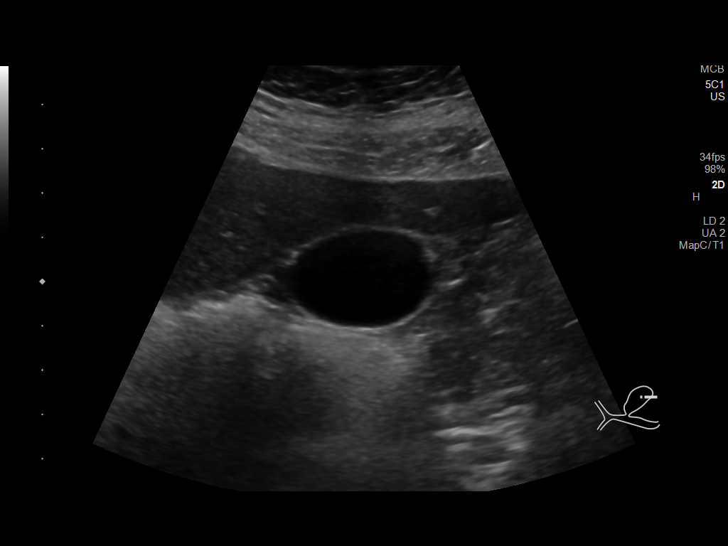
[im 16/46]
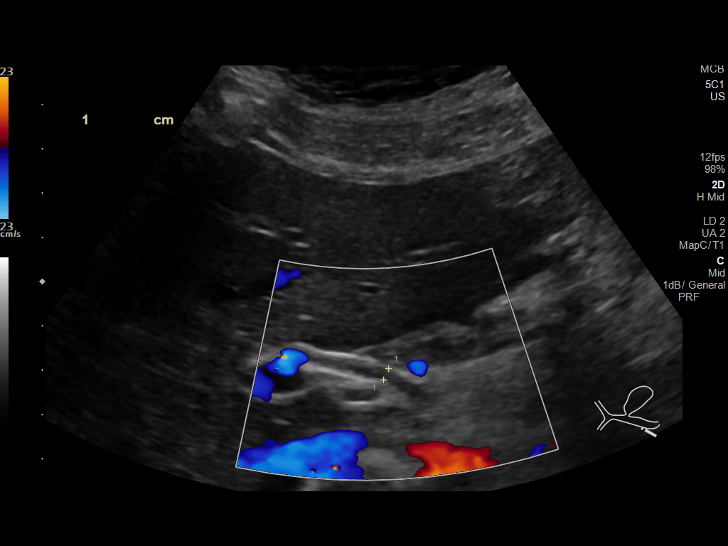
[im 17/46]
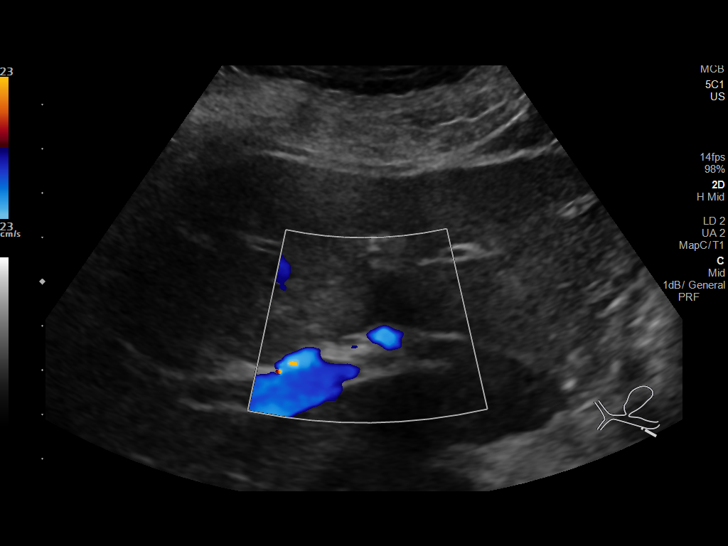
[im 21/46]
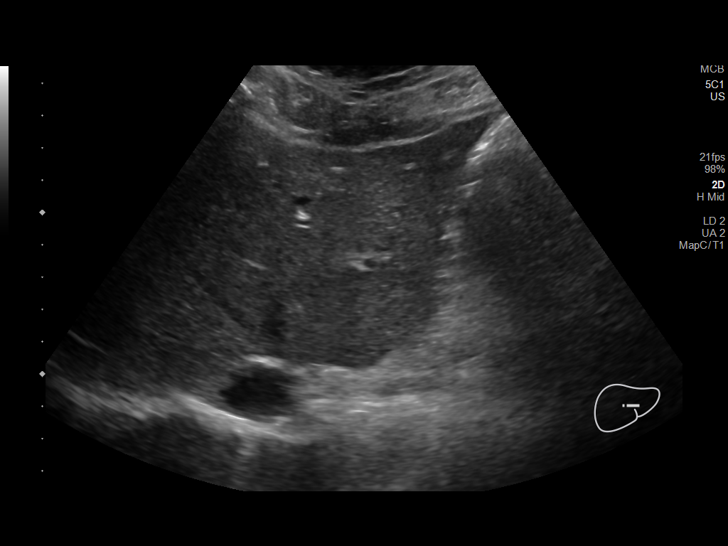
[im 25/46]
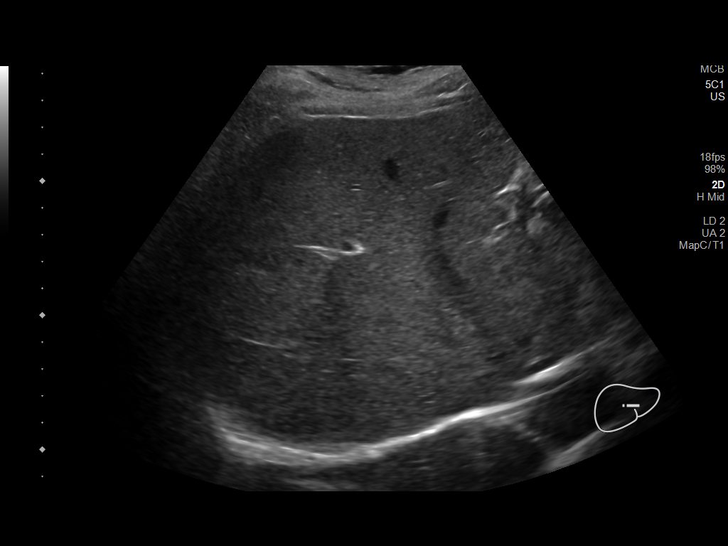
[im 29/46]
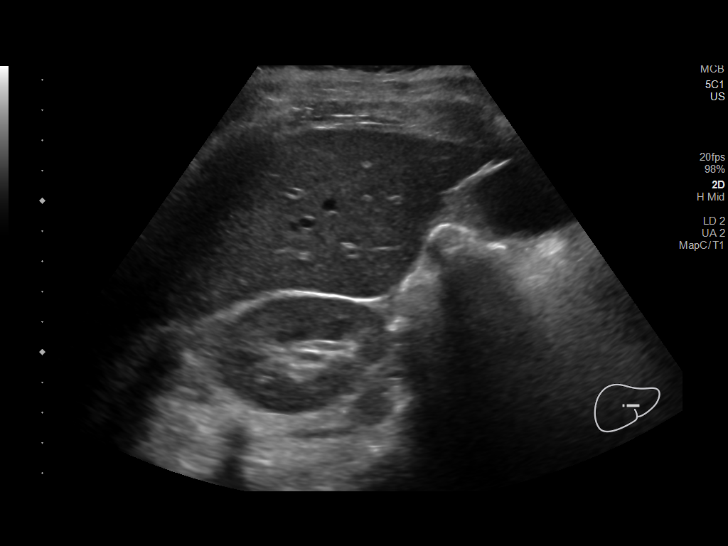
[im 31/46]
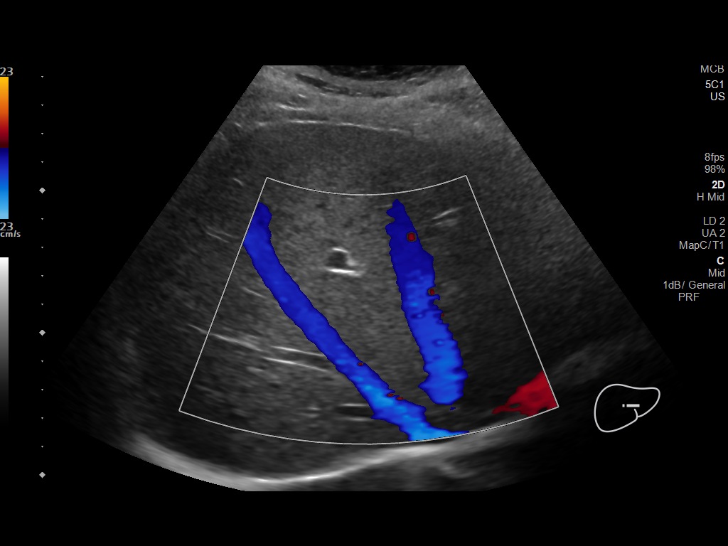
[im 34/46]
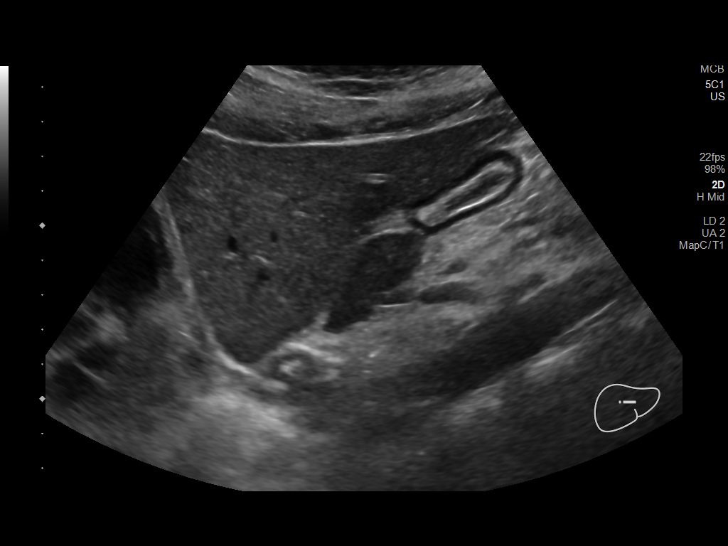
[im 38/46]
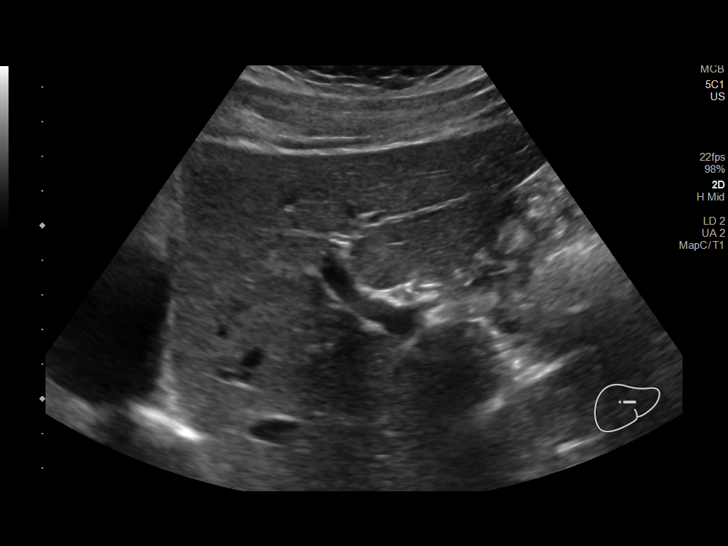
[im 42/46]
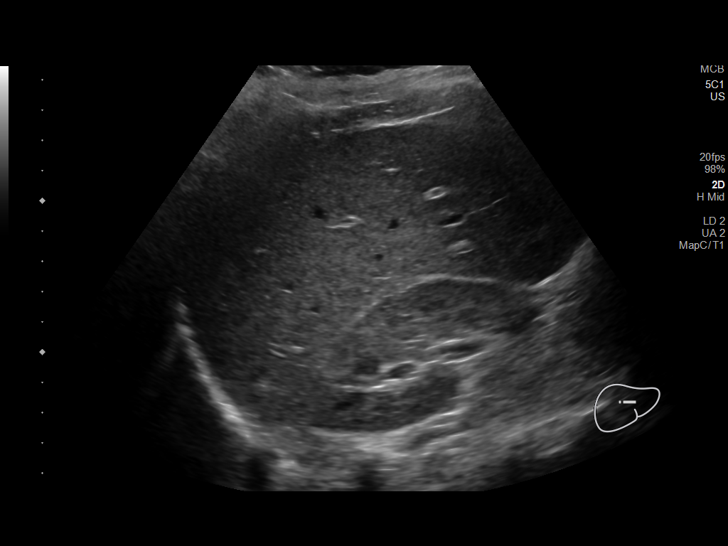
[im 46/46]
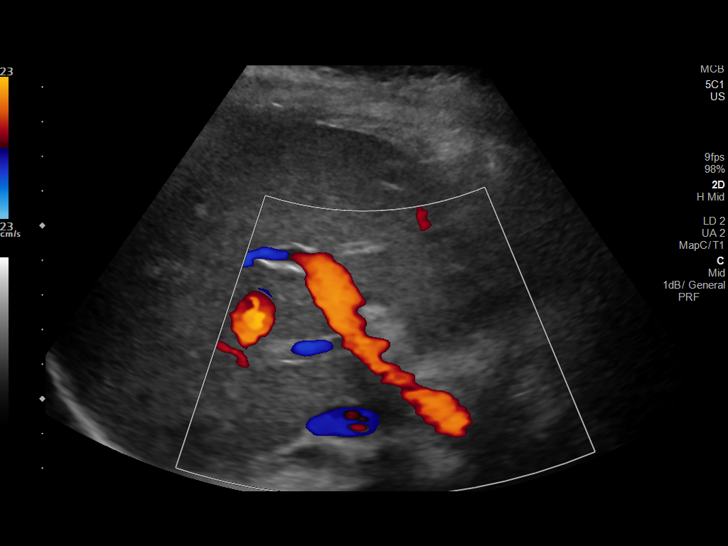

[14 of 25 positions shown; findings below may reference images not displayed]

FINDINGS: Gallbladder:

No gallstones or wall thickening visualized. No sonographic Murphy
sign noted by sonographer.

Common bile duct:

Diameter: 2.8 mm.

Liver:

No focal lesion identified. Within normal limits in parenchymal
echogenicity. Portal vein is patent on color Doppler imaging with
normal direction of blood flow towards the liver.

Other: None.
IMPRESSION: No acute findings.
